# Patient Record
Sex: Male | Born: 1975 | Race: White | Hispanic: No | Marital: Married | Smoking: Former smoker
Health system: Southern US, Community
[De-identification: ages and names within clinical notes are randomized; demographics above are authoritative.]

## PROBLEM LIST (undated history)

## (undated) DIAGNOSIS — R51 Headache: Secondary | ICD-10-CM

## (undated) DIAGNOSIS — T4145XA Adverse effect of unspecified anesthetic, initial encounter: Secondary | ICD-10-CM

## (undated) DIAGNOSIS — K219 Gastro-esophageal reflux disease without esophagitis: Secondary | ICD-10-CM

## (undated) DIAGNOSIS — R519 Headache, unspecified: Secondary | ICD-10-CM

## (undated) DIAGNOSIS — T8859XA Other complications of anesthesia, initial encounter: Secondary | ICD-10-CM

## (undated) HISTORY — PX: APPENDECTOMY: SHX54

---
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
## 2015-11-01 ENCOUNTER — Other Ambulatory Visit: Payer: Self-pay

## 2015-11-01 ENCOUNTER — Encounter: Payer: Self-pay | Admitting: *Deleted

## 2015-11-01 NOTE — Patient Instructions (Signed)
  Your procedure is scheduled on: 11-02-15  Report to [facility] SAME DAY SURGERY 2ND FLOOR @ 1:45 (PT NOTIFIED OF TIME)   Remember: Instructions that are not followed completely may result in serious medical risk, up to and including death, or upon the discretion of your surgeon and anesthesiologist your surgery may need to be rescheduled.    _X___ 1. Do not eat food or drink liquids after midnight. No gum chewing or hard candies.     _X___ 2. No Alcohol for 24 hours before or after surgery.   ____ 3. Bring all medications with you on the day of surgery if instructed.    ____ 4. Notify your doctor if there is any change in your medical condition     (cold, fever, infections).     Do not wear jewelry, make-up, hairpins, clips or nail polish.  Do not wear lotions, powders, or perfumes. You may wear deodorant.  Do not shave 48 hours prior to surgery. Men may shave face and neck.  Do not bring valuables to the hospital.    [facility] is not responsible for any belongings or valuables.               Contacts, dentures or bridgework may not be worn into surgery.  Leave your suitcase in the car. After surgery it may be brought to your room.  For patients admitted to the hospital, discharge time is determined by your treatment team.   Patients discharged the day of surgery will not be allowed to drive home.   Please read over the following fact sheets that you were given:     ____ Take these medicines the morning of surgery with A SIP OF WATER:    1. NONE  2.   3.   4.  5.  6.  ____ Fleet Enema (as directed)   ____ Use CHG Soap as directed  ____ Use inhalers on the day of surgery  ____ Stop metformin 2 days prior to surgery    ____ Take 1/2 of usual insulin dose the night before surgery and none on the morning of surgery.   ____ Stop Coumadin/Plavix/aspirin-N/A  _X__ Stop Anti-inflammatories-STOP ADVIL NOW-NO NSAIDS OR ASA PRODUCTS-TYLENOL OK   ____ Stop supplements  until after surgery.    ____ Bring C-Pap to the hospital.

## 2015-11-02 ENCOUNTER — Ambulatory Visit
Admission: RE | Admit: 2015-11-02 | Discharge: 2015-11-02 | Disposition: A | Discharge status: Home / Self Care | Payer: 59 | Source: Ambulatory Visit | Attending: Surgery | Admitting: Surgery

## 2015-11-02 ENCOUNTER — Encounter: Payer: Self-pay | Admitting: *Deleted

## 2015-11-02 ENCOUNTER — Ambulatory Visit: Payer: 59 | Admitting: Anesthesiology

## 2015-11-02 ENCOUNTER — Encounter
Admission: RE | Disposition: A | Discharge status: Home / Self Care | Payer: Self-pay | Source: Ambulatory Visit | Attending: Surgery

## 2015-11-02 DIAGNOSIS — K219 Gastro-esophageal reflux disease without esophagitis: Secondary | ICD-10-CM | POA: Diagnosis not present

## 2015-11-02 DIAGNOSIS — Z87891 Personal history of nicotine dependence: Secondary | ICD-10-CM | POA: Insufficient documentation

## 2015-11-02 DIAGNOSIS — G5621 Lesion of ulnar nerve, right upper limb: Secondary | ICD-10-CM | POA: Insufficient documentation

## 2015-11-02 HISTORY — DX: Headache: R51

## 2015-11-02 HISTORY — DX: Gastro-esophageal reflux disease without esophagitis: K21.9

## 2015-11-02 HISTORY — PX: ULNAR NERVE TRANSPOSITION: SHX2595

## 2015-11-02 HISTORY — DX: Headache, unspecified: R51.9

## 2015-11-02 SURGERY — ULNAR NERVE DECOMPRESSION/TRANSPOSITION
Anesthesia: General | Site: Arm Upper | Laterality: Right | Wound class: Clean

## 2015-11-02 MED ORDER — ONDANSETRON HCL 4 MG/2ML IJ SOLN
INTRAMUSCULAR | Status: DC | PRN
  Administered 2015-11-02: 4 mg via INTRAVENOUS

## 2015-11-02 MED ORDER — POTASSIUM CHLORIDE IN NACL 20-0.9 MEQ/L-% IV SOLN: INTRAVENOUS | Status: DC

## 2015-11-02 MED ORDER — ONDANSETRON HCL 4 MG/2ML IJ SOLN: 4.0000 mg | Freq: Once | INTRAMUSCULAR | Status: DC | PRN

## 2015-11-02 MED ORDER — DEXAMETHASONE SODIUM PHOSPHATE 10 MG/ML IJ SOLN
INTRAMUSCULAR | Status: DC | PRN
  Administered 2015-11-02: 10 mg via INTRAVENOUS

## 2015-11-02 MED ORDER — DEXTROSE 5 % IV SOLN
3.0000 g | Freq: Once | INTRAVENOUS | Status: AC
  Administered 2015-11-02: 3 g via INTRAVENOUS
  Filled 2015-11-02: qty 3000

## 2015-11-02 MED ORDER — ONDANSETRON HCL 4 MG/2ML IJ SOLN
INTRAMUSCULAR | Status: AC
  Filled 2015-11-02: qty 2

## 2015-11-02 MED ORDER — METOCLOPRAMIDE HCL 5 MG/ML IJ SOLN: 5.0000 mg | Freq: Three times a day (TID) | INTRAMUSCULAR | Status: DC | PRN

## 2015-11-02 MED ORDER — NEOMYCIN-POLYMYXIN B GU 40-200000 IR SOLN
Status: DC | PRN
  Administered 2015-11-02: 2 mL

## 2015-11-02 MED ORDER — BUPIVACAINE HCL (PF) 0.5 % IJ SOLN
INTRAMUSCULAR | Status: AC
Start: 2015-11-02 — End: 2015-11-02
  Filled 2015-11-02: qty 30

## 2015-11-02 MED ORDER — METOCLOPRAMIDE HCL 10 MG PO TABS: 5.0000 mg | ORAL_TABLET | Freq: Three times a day (TID) | ORAL | Status: DC | PRN

## 2015-11-02 MED ORDER — MIDAZOLAM HCL 2 MG/2ML IJ SOLN
INTRAMUSCULAR | Status: DC | PRN
  Administered 2015-11-02: 2 mg via INTRAVENOUS

## 2015-11-02 MED ORDER — OXYCODONE HCL 5 MG PO TABS
ORAL_TABLET | ORAL | Status: AC
  Filled 2015-11-02: qty 1

## 2015-11-02 MED ORDER — FAMOTIDINE 20 MG PO TABS
20.0000 mg | ORAL_TABLET | Freq: Once | ORAL | Status: AC
  Administered 2015-11-02: 20 mg via ORAL

## 2015-11-02 MED ORDER — ONDANSETRON HCL 4 MG/2ML IJ SOLN
4.0000 mg | Freq: Four times a day (QID) | INTRAMUSCULAR | Status: DC | PRN
  Administered 2015-11-02: 4 mg via INTRAVENOUS

## 2015-11-02 MED ORDER — FENTANYL CITRATE (PF) 100 MCG/2ML IJ SOLN
INTRAMUSCULAR | Status: DC | PRN
  Administered 2015-11-02 (×3): 50 ug via INTRAVENOUS

## 2015-11-02 MED ORDER — FENTANYL CITRATE (PF) 100 MCG/2ML IJ SOLN
25.0000 ug | INTRAMUSCULAR | Status: DC | PRN
  Administered 2015-11-02 (×5): 25 ug via INTRAVENOUS

## 2015-11-02 MED ORDER — LIDOCAINE HCL (CARDIAC) 20 MG/ML IV SOLN
INTRAVENOUS | Status: DC | PRN
  Administered 2015-11-02: 30 mg via INTRAVENOUS

## 2015-11-02 MED ORDER — FENTANYL CITRATE (PF) 100 MCG/2ML IJ SOLN
INTRAMUSCULAR | Status: AC
  Filled 2015-11-02: qty 2

## 2015-11-02 MED ORDER — BUPIVACAINE HCL (PF) 0.5 % IJ SOLN
INTRAMUSCULAR | Status: DC | PRN
  Administered 2015-11-02: 20 mL

## 2015-11-02 MED ORDER — OXYCODONE HCL 5 MG PO TABS: 5.0000 mg | ORAL_TABLET | ORAL | Status: DC | PRN

## 2015-11-02 MED ORDER — OXYCODONE HCL 5 MG PO TABS
5.0000 mg | ORAL_TABLET | ORAL | Status: DC | PRN
  Administered 2015-11-02: 5 mg via ORAL

## 2015-11-02 MED ORDER — NEOMYCIN-POLYMYXIN B GU 40-200000 IR SOLN
Status: AC
  Filled 2015-11-02: qty 2

## 2015-11-02 MED ORDER — ONDANSETRON HCL 4 MG PO TABS: 4.0000 mg | ORAL_TABLET | Freq: Four times a day (QID) | ORAL | Status: DC | PRN

## 2015-11-02 MED ORDER — PROPOFOL 10 MG/ML IV BOLUS
INTRAVENOUS | Status: DC | PRN
  Administered 2015-11-02: 300 mg via INTRAVENOUS

## 2015-11-02 MED ORDER — FAMOTIDINE 20 MG PO TABS
ORAL_TABLET | ORAL | Status: AC
  Administered 2015-11-02: 20 mg via ORAL
  Filled 2015-11-02: qty 1

## 2015-11-02 MED ORDER — LACTATED RINGERS IV SOLN
INTRAVENOUS | Status: DC
  Administered 2015-11-02: 14:00:00 via INTRAVENOUS

## 2015-11-02 SURGICAL SUPPLY — 28 items
BANDAGE ELASTIC 4 LF NS (GAUZE/BANDAGES/DRESSINGS) ×3 IMPLANT
BNDG COHESIVE 4X5 TAN STRL (GAUZE/BANDAGES/DRESSINGS) ×3 IMPLANT
BNDG ESMARK 4X12 TAN STRL LF (GAUZE/BANDAGES/DRESSINGS) ×3 IMPLANT
CANISTER SUCT 1200ML W/VALVE (MISCELLANEOUS) ×3 IMPLANT
CHLORAPREP W/TINT 26ML (MISCELLANEOUS) ×6 IMPLANT
CORD BIP STRL DISP 12FT (MISCELLANEOUS) ×3 IMPLANT
FORCEPS JEWEL BIP 4-3/4 STR (INSTRUMENTS) ×3 IMPLANT
GAUZE PETRO XEROFOAM 1X8 (MISCELLANEOUS) ×3 IMPLANT
GAUZE SPONGE 4X4 12PLY STRL (GAUZE/BANDAGES/DRESSINGS) ×3 IMPLANT
GLOVE INDICATOR 8.0 STRL GRN (GLOVE) ×3 IMPLANT
GOWN STRL REUS W/ TWL LRG LVL3 (GOWN DISPOSABLE) ×1 IMPLANT
GOWN STRL REUS W/ TWL XL LVL3 (GOWN DISPOSABLE) ×1 IMPLANT
GOWN STRL REUS W/TWL LRG LVL3 (GOWN DISPOSABLE) ×2
GOWN STRL REUS W/TWL XL LVL3 (GOWN DISPOSABLE) ×2
KIT RM TURNOVER STRD PROC AR (KITS) ×3 IMPLANT
LOOP RED MAXI  1X406MM (MISCELLANEOUS) ×2
LOOP VESSEL MAXI 1X406 RED (MISCELLANEOUS) ×1 IMPLANT
NS IRRIG 500ML POUR BTL (IV SOLUTION) ×3 IMPLANT
PACK EXTREMITY ARMC (MISCELLANEOUS) ×3 IMPLANT
PAD ABD DERMACEA PRESS 5X9 (GAUZE/BANDAGES/DRESSINGS) ×3 IMPLANT
PAD CAST CTTN 4X4 STRL (SOFTGOODS) ×3 IMPLANT
PAD GROUND ADULT SPLIT (MISCELLANEOUS) ×3 IMPLANT
PADDING CAST COTTON 4X4 STRL (SOFTGOODS) ×6
STAPLER SKIN PROX 35W (STAPLE) ×3 IMPLANT
STOCKINETTE IMPERVIOUS 9X36 MD (GAUZE/BANDAGES/DRESSINGS) ×3 IMPLANT
SUT VIC AB 2-0 CT1 36 (SUTURE) ×3 IMPLANT
SUT VIC AB 3-0 SH 27 (SUTURE) ×2
SUT VIC AB 3-0 SH 27X BRD (SUTURE) ×1 IMPLANT

## 2015-11-02 NOTE — Discharge Instructions (Addendum)
Keep dressing dry and intact. Keep hand elevated above heart level. Use sling as necessary for comfort. May shower after dressing removed on postop day 4 (Monday). Cover sutures with Band-Aids after drying off. Apply ice to affected area frequently.Return for follow-up in 10-14 days or as scheduled     .AMBULATORY SURGERY  DISCHARGE INSTRUCTIONS   1) The drugs that you were given will stay in your system until tomorrow so for the next 24 hours you should not:  A) Drive an automobile B) Make any legal decisions C) Drink any alcoholic beverage   2) You may resume regular meals tomorrow.  Today it is better to start with liquids and gradually work up to solid foods.  You may eat anything you prefer, but it is better to start with liquids, then soup and crackers, and gradually work up to solid foods.   3) Please notify your doctor immediately if you have any unusual bleeding, trouble breathing, redness and pain at the surgery site, drainage, fever, or pain not relieved by medication. 4)   5) Your post-operative visit with Dr.                                     is: Date:                        Time:    Please call to schedule your post-operative visit.  6) Additional Instructions:

## 2015-11-02 NOTE — Transfer of Care (Signed)
Immediate Anesthesia Transfer of Care Note  Patient: Ryan Stephenson  Procedure(s) Performed: Procedure(s): ULNAR NERVE DECOMPRESSION/TRANSPOSITION (Right)  Patient Location: PACU  Anesthesia Type:General  Level of Consciousness: patient cooperative and lethargic  Airway & Oxygen Therapy: Patient Spontanous Breathing and Patient connected to face mask oxygen  Post-op Assessment: Report given to RN and Post -op Vital signs reviewed and stable  Post vital signs: Reviewed and stable  Last Vitals:  Filed Vitals:   11/02/15 1638  BP: 148/109  Temp: 37.7 C  Resp: 19    Complications: no anesthetic complications

## 2015-11-02 NOTE — Anesthesia Postprocedure Evaluation (Signed)
Anesthesia Post Note  Patient: Ryan ButtnerSteven Stephenson  Procedure(s) Performed: Procedure(s) (LRB): ULNAR NERVE DECOMPRESSION/TRANSPOSITION (Right)  Patient location during evaluation: PACU Anesthesia Type: General Level of consciousness: awake and alert Pain management: pain level controlled Vital Signs Assessment: post-procedure vital signs reviewed and stable Respiratory status: spontaneous breathing and respiratory function stable Cardiovascular status: blood pressure returned to baseline and stable Anesthetic complications: no    Last Vitals:  Filed Vitals:   11/02/15 1638  BP: 148/109  Pulse: 109  Temp: 37.7 C  Resp: 19    Last Pain:  Filed Vitals:   11/02/15 1646  PainSc: 0-No pain                 Lilith Solana K

## 2015-11-02 NOTE — Anesthesia Procedure Notes (Signed)
Procedure Name: LMA Insertion Date/Time: 11/02/2015 3:23 PM Performed by: Omer JackWEATHERLY, Ryan Trowbridge Pre-anesthesia Checklist: Patient identified, Patient being monitored, Timeout performed, Emergency Drugs available and Suction available Patient Re-evaluated:Patient Re-evaluated prior to inductionOxygen Delivery Method: Circle system utilized Preoxygenation: Pre-oxygenation with 100% oxygen Intubation Type: IV induction Ventilation: Mask ventilation without difficulty LMA: LMA inserted LMA Size: 4.5 Tube type: Oral Number of attempts: 1 Placement Confirmation: positive ETCO2 and breath sounds checked- equal and bilateral Tube secured with: Tape Dental Injury: Teeth and Oropharynx as per pre-operative assessment

## 2015-11-02 NOTE — Anesthesia Preprocedure Evaluation (Signed)
Anesthesia Evaluation  Patient identified by MRN, date of birth, ID band Patient awake    Reviewed: Allergy & Precautions, NPO status , Patient's Chart, lab work & pertinent test results  Airway Mallampati: II  TM Distance: >3 FB Neck ROM: Full    Dental no notable dental hx.    Pulmonary former smoker,    Pulmonary exam normal breath sounds clear to auscultation       Cardiovascular negative cardio ROS Normal cardiovascular exam     Neuro/Psych  Headaches, negative psych ROS   GI/Hepatic Neg liver ROS, GERD  Medicated and Controlled,  Endo/Other  negative endocrine ROS  Renal/GU negative Renal ROS  negative genitourinary   Musculoskeletal negative musculoskeletal ROS (+)   Abdominal Normal abdominal exam  (+)   Peds negative pediatric ROS (+)  Hematology negative hematology ROS (+)   Anesthesia Other Findings   Reproductive/Obstetrics                             Anesthesia Physical Anesthesia Plan  ASA: II  Anesthesia Plan: General   Post-op Pain Management:    Induction: Intravenous  Airway Management Planned: Oral ETT  Additional Equipment:   Intra-op Plan:   Post-operative Plan: Extubation in OR  Informed Consent: I have reviewed the patients History and Physical, chart, labs and discussed the procedure including the risks, benefits and alternatives for the proposed anesthesia with the patient or authorized representative who has indicated his/her understanding and acceptance.   Dental advisory given  Plan Discussed with: CRNA and Surgeon  Anesthesia Plan Comments:         Anesthesia Quick Evaluation

## 2015-11-02 NOTE — H&P (Signed)
Paper H&P to be scanned into permanent record. H&P reviewed. No changes. 

## 2015-11-02 NOTE — Op Note (Signed)
11/02/2015  4:34 PM  Patient:   Ryan Stephenson  Pre-Op Diagnosis:   Right cubital tunnel syndrome.  Post-Op Diagnosis:   Same  Procedure:   Subcutaneous anterior transposition ulnar nerve, right elbow.  Surgeon:   Maryagnes AmosJ. Jeffrey Poggi, MD  Assistant:   None  Anesthesia:   General LMA  Findings:   As above.  Complications:   None  EBL:   <5 cc  Fluids:   550 cc crystalloid  TT:   48 minutes at 250 mmHg  Drains:   None  Closure:   3-0 Vicryl subcuticular sutures  Brief Clinical Note:   The patient is a 39 year old male with a history of progressively worsening pain and paresthesias to the right ring and little fingers. His symptoms have persisted despite medications, activity modification, etc. The patient's history and examination were consistent with cubital tunnel syndrome, confirmed by an EMG. The patient presents at this time for subcutaneous anterior transposition of the ulnar nerve at the right elbow.  Procedure:   The patient was brought into the operating room and lain in the supine position. After adequate general laryngal mask anesthesia was obtained, the patient's right upper extremity was prepped with ChloraPrep solution before being draped sterilely. Preoperative antibiotics were administered. After performing a timeout to verify the appropriate surgical site, the limb was exsanguinated with an Esmarch and the tourniquet inflated to 250 mmHg. An approximately 7-8 cm curvilinear incision was made along the course of the ulnar nerve posterior to the medial epicondyle. The incision was carried down through the subcutaneous tissues with care taken to avoid the small branches of the medial antebrachial nerve to expose the sheath overlying the cubital tunnel. The ulnar nerve was identified at the proximal end of the tunnel and was dissected free. The nerve was then carefully followed as the roof of the cubital tunnel was released from proximal to distal. Distally, the fascia  overlying the pronator muscle was released for several centimeters. The nerve was clearly thickened just proximal to the pronator fascia, indicating the most likely source of the nerve compression. A vessel loop was passed around the nerve and used to provide gentle traction on the nerve while circumferential dissection was carried out under loupe magnification using bipolar electrocautery and tenotomy scissors.   Once the nerve was fully mobilized, the anterior tissues were elevated as a flap just superficial to the fascia overlying the flexor wad and a pocket created to accept the nerve. Care was taken to be sure that there was no undue tension along the nerve either proximally or distally. The nerve was carefully retracted while several 2-0 Vicryl interrupted sutures were placed to reapproximate the flap to the medial epicondylar soft tissues, thereby creating a "sling" for the ulnar nerve. The cubital tunnel itself was reapproximated using several 2-0 Vicryl interrupted sutures in order to prevent the nerve from falling back into the cubital tunnel.   The wound was copiously irrigated with sterile saline solution before the subcutaneous tissues were closed using 2-0 Vicryl interrupted sutures. The skin was closed using 3-0 Vicryl subcuticular sutures. A total of 20 cc of 0.5% plain Sensorcaine was injected in and around the incision to help with postoperative analgesia. A sterile bulky dressing was applied to the arm before the patient was placed into a sling. The patient was then awakened, extubated, and returned to the recovery room in satisfactory condition after tolerating the procedure well.

## 2015-11-03 ENCOUNTER — Encounter: Payer: Self-pay | Admitting: Surgery

## 2019-10-20 ENCOUNTER — Other Ambulatory Visit: Payer: Self-pay | Admitting: Student

## 2019-10-20 DIAGNOSIS — S83241D Other tear of medial meniscus, current injury, right knee, subsequent encounter: Secondary | ICD-10-CM

## 2019-10-20 DIAGNOSIS — M1711 Unilateral primary osteoarthritis, right knee: Secondary | ICD-10-CM

## 2019-10-20 DIAGNOSIS — S83411D Sprain of medial collateral ligament of right knee, subsequent encounter: Secondary | ICD-10-CM

## 2019-10-31 ENCOUNTER — Other Ambulatory Visit: Payer: Self-pay

## 2019-10-31 ENCOUNTER — Ambulatory Visit
Admission: RE | Admit: 2019-10-31 | Discharge: 2019-10-31 | Disposition: A | Discharge status: Home / Self Care | Payer: 59 | Source: Ambulatory Visit | Attending: Student | Admitting: Student

## 2019-10-31 DIAGNOSIS — S83411D Sprain of medial collateral ligament of right knee, subsequent encounter: Secondary | ICD-10-CM | POA: Insufficient documentation

## 2019-10-31 DIAGNOSIS — M1711 Unilateral primary osteoarthritis, right knee: Secondary | ICD-10-CM | POA: Insufficient documentation

## 2019-10-31 DIAGNOSIS — S83241D Other tear of medial meniscus, current injury, right knee, subsequent encounter: Secondary | ICD-10-CM | POA: Insufficient documentation

## 2019-12-06 ENCOUNTER — Other Ambulatory Visit: Payer: Self-pay | Admitting: Surgery

## 2019-12-07 ENCOUNTER — Other Ambulatory Visit: Payer: Self-pay

## 2019-12-07 ENCOUNTER — Other Ambulatory Visit
Admission: RE | Admit: 2019-12-07 | Discharge: 2019-12-07 | Disposition: A | Discharge status: Home / Self Care | Payer: 59 | Source: Ambulatory Visit | Attending: Surgery | Admitting: Surgery

## 2019-12-07 HISTORY — DX: Other complications of anesthesia, initial encounter: T88.59XA

## 2019-12-07 NOTE — Pre-Procedure Instructions (Signed)
Copied from [place] 07/08/2019 [place] Component Name Value Ref Range  EKG Systolic BP  mmHg  EKG Diastolic BP  mmHg  EKG Ventricular Rate 88 BPM  EKG Atrial Rate 88 BPM  EKG P-R Interval 152 ms  EKG QRS Duration 90 ms  EKG Q-T Interval 356 ms  EKG QTC Calculation 430 ms  EKG Calculated P Axis 27 degrees  EKG Calculated R Axis 3 degrees  EKG Calculated T Axis 25 degrees  QTC Fredericia 404 ms  Result Narrative  NORMAL SINUS RHYTHM NORMAL ECG  Confirmed by Dimas [place] [phone]) on 07/08/2019 10:42:27 AM  Other Result Information  Interface, Rad Results In - 07/08/2019 10:42 AM EDT NORMAL SINUS RHYTHM NORMAL ECG  Confirmed by Dimas [place] [phone]) on 07/08/2019 10:42:27 AM     CBC w/ Differential (07/07/2019 10:58 PM EDT) CBC w/ Differential (07/07/2019 10:58 PM EDT)  Component Value Ref Range Performed At Pathologist Signature  WBC 10.2 3.4 - 10.8 10*9/L [place] [facility]   RBC 5.05 4.14 - 5.80 10*12/L [place]   HGB 15.3 12.6 - 17.7 g/dL [place]   HCT 44.9 37.5 - 51.0 % [place] [facility]   MCV 88.9 79.0 - 97.0 fL [place]   MCH 30.3 27.0 - 33.0 pg [place]   MCHC 34.1 31.5 - 35.7 g/dL [place]   RDW 13.8 12.3 - 15.4 % [place]   MPV 9.9 9.0 - 12.0 fL [place] [facility]   Platelet 204 155 - 379 10*9/L [place] [facility]   Neutrophils % 58.1 % [place] [facility]   Lymphocytes % 31.4 % [place] [facility]   Monocytes % 8.8 % [place] [facility]   Eosinophils % 1.3 % [place] [facility]   Basophils % 0.4 % [place] [facility]   Absolute Neutrophils 5.9 1.4 - 7.0 10*9/L [place] [facility]   Absolute Lymphocytes 3.2 (H) 0.7 - 3.1 10*9/L [place]  [facility]   Absolute Monocytes 0.9 0.1 - 0.9 10*9/L [place] [facility]   Absolute Eosinophils 0.1 0.0 - 0.4 10*9/L [place] [facility]   Absolute Basophils 0.0 0.0 - 0.2 10*9/L [place]    CBC w/ Differential (07/07/2019 10:58 PM EDT)  Specimen  Blood   CBC w/ Differential (07/07/2019 10:58 PM EDT)  Performing Organization Address City/State/ZIP Code Phone Number  [place]  [place], [place] [address]  [phone]   Back to top of Lab Results    CBC w/ Differential (07/07/2019 10:58 PM EDT) CBC w/ Differential (07/07/2019 10:58 PM EDT)  Specimen  Blood   CBC w/ Differential (07/07/2019 10:58 PM EDT)  Narrative Performed At  The following orders were created for panel order CBC w/ Differential.  Procedure                Abnormality     Status            ---------                -----------     ------            CBC w/ Differential[[id number]]     Abnormal      Final result           Please view results for these tests  on the individual orders.     Back to top of Lab Results    Basic Metabolic Panel (61/60/7371 10:58 PM EDT) Basic Metabolic Panel (05/13/9484 10:58 PM EDT)  Component Value Ref Range Performed At Pathologist Signature  Sodium 141 135 - 145 mmol/L [place]   Potassium 3.9 3.5 - 5.0 mmol/L [facility]   Chloride 106 98 - 107 mmol/L [place] [facility]   CO2 24.0 22.0 - 32.0 mmol/L [place] [facility]   Anion Gap 11 7 - 15 mmol/L [place] [facility]   BUN 13 7 - 21 mg/dL [place] [facility]   Creatinine 0.80 0.70 - 1.30 mg/dL [place] [facility]   BUN/Creatinine Ratio 16  [place] [facility]   EGFR CKD-EPI Non-African American, Male >90 >=60 mL/min/1.36m [facility]   EGFR CKD-EPI African American, Male >90 >=60 mL/min/1.[facility]   Glucose 92 74 - 106 mg/dL [facility]   Calcium 9.3 8.5 - 10.2 mg/dL [facility]    Basic Metabolic Panel (0846/27/03500:58 PM EDT)  Specimen  Blood   Basic Metabolic Panel (0809/38/18290:58 PM EDT)  Performing Organization Address City/State/ZIP Code Phone Number  [id number] [phone] Back to top of Lab Results

## 2019-12-07 NOTE — Patient Instructions (Signed)
Your procedure is scheduled on: 12/10/19 Report to DAY SURGERY DEPARTMENT LOCATED ON 2ND FLOOR [facility] ENTRANCE. To find out your arrival time please call [phone] between 1PM - 3PM on 12/09/19 .  Remember: Instructions that are not followed completely may result in serious medical risk, up to and including death, or upon the discretion of your surgeon and anesthesiologist your surgery may need to be rescheduled.     _X__ 1. Do not eat food after midnight the night before your procedure.                 No gum chewing or hard candies. You may drink clear liquids up to 2 hours                 before you are scheduled to arrive for your surgery- DO not drink clear                 liquids within 2 hours of the start of your surgery.                 Clear Liquids include:  water, apple juice without pulp, clear carbohydrate                 drink such as Clearfast or Gatorade, Black Coffee or Tea (Do not add                 anything to coffee or tea). Diabetics water only  __X__2.  On the morning of surgery brush your teeth with toothpaste and water, you                 may rinse your mouth with mouthwash if you wish.  Do not swallow any              toothpaste of mouthwash.     _X__ 3.  No Alcohol for 24 hours before or after surgery.   _X__ 4.  Do Not Smoke or use e-cigarettes For 24 Hours Prior to Your Surgery.                 Do not use any chewable tobacco products for at least 6 hours prior to                 surgery.  ____  5.  Bring all medications with you on the day of surgery if instructed.   __X__  6.  Notify your doctor if there is any change in your medical condition      (cold, fever, infections).     Do not wear jewelry, make-up, hairpins, clips or nail polish. Do not wear lotions, powders, or perfumes.  Do not shave 48 hours prior to surgery. Men may shave face and neck. Do not bring valuables to the hospital.    [facility] is not responsible for any belongings  or valuables.  Contacts, dentures/partials or body piercings may not be worn into surgery. Bring a case for your contacts, glasses or hearing aids, a denture cup will be supplied. Leave your suitcase in the car. After surgery it may be brought to your room. For patients admitted to the hospital, discharge time is determined by your treatment team.   Patients discharged the day of surgery will not be allowed to drive home.   Please read over the following fact sheets that you were given:   MRSA Information  __X__ Take these medicines the morning of surgery with A SIP OF WATER:  1. none  2.   3.   4.  5.  6.  ____ Fleet Enema (as directed)   __X__ Use CHG Soap/SAGE wipes as directed  ____ Use inhalers on the day of surgery  ____ Stop metformin/Janumet/Farxiga 2 days prior to surgery    ____ Take 1/2 of usual insulin dose the night before surgery. No insulin the morning          of surgery.   ____ Stop Blood Thinners Coumadin/Plavix/Xarelto/Pleta/Pradaxa/Eliquis/Effient/Aspirin  on   Or contact your Surgeon, Cardiologist or Medical Doctor regarding  ability to stop your blood thinners  __X__ Stop Anti-inflammatories 7 days before surgery such as Advil, Ibuprofen, Motrin,  BC or Goodies Powder, Naprosyn, Naproxen, Aleve, Aspirin    __X__ Stop all herbal supplements, fish oil or vitamin E until after surgery.    ____ Bring C-Pap to the hospital.     The Ensure pre surgery drinks needs to be completed 2 hours before arrival. Bring the Incentive Spirometer the day of procedure if you have any questions and your pre or post op nurse can assist you.

## 2019-12-08 ENCOUNTER — Other Ambulatory Visit
Admission: RE | Admit: 2019-12-08 | Discharge: 2019-12-08 | Disposition: A | Discharge status: Home / Self Care | Payer: 59 | Source: Ambulatory Visit | Attending: Surgery | Admitting: Surgery

## 2019-12-08 NOTE — Progress Notes (Signed)
Positive covid test in care everywhere on 10/27/2019. Does not need a repeat covid test at this time.

## 2019-12-09 MED ORDER — DEXTROSE 5 % IV SOLN
3.0000 g | INTRAVENOUS | Status: AC
  Administered 2019-12-10: 3 g via INTRAVENOUS
  Filled 2019-12-09: qty 3

## 2019-12-10 ENCOUNTER — Encounter
Admission: RE | Disposition: A | Discharge status: Home / Self Care | Payer: Self-pay | Source: Home / Self Care | Attending: Surgery

## 2019-12-10 ENCOUNTER — Ambulatory Visit
Admission: RE | Admit: 2019-12-10 | Discharge: 2019-12-10 | Disposition: A | Discharge status: Home / Self Care | Payer: 59 | Attending: Surgery | Admitting: Surgery

## 2019-12-10 ENCOUNTER — Ambulatory Visit: Payer: 59 | Admitting: Anesthesiology

## 2019-12-10 ENCOUNTER — Other Ambulatory Visit: Payer: Self-pay

## 2019-12-10 ENCOUNTER — Encounter: Payer: Self-pay | Admitting: Surgery

## 2019-12-10 DIAGNOSIS — X58XXXA Exposure to other specified factors, initial encounter: Secondary | ICD-10-CM | POA: Diagnosis not present

## 2019-12-10 DIAGNOSIS — Z79899 Other long term (current) drug therapy: Secondary | ICD-10-CM | POA: Diagnosis not present

## 2019-12-10 DIAGNOSIS — S83231S Complex tear of medial meniscus, current injury, right knee, sequela: Secondary | ICD-10-CM

## 2019-12-10 DIAGNOSIS — S83241A Other tear of medial meniscus, current injury, right knee, initial encounter: Secondary | ICD-10-CM | POA: Diagnosis not present

## 2019-12-10 DIAGNOSIS — M1711 Unilateral primary osteoarthritis, right knee: Secondary | ICD-10-CM | POA: Insufficient documentation

## 2019-12-10 DIAGNOSIS — Z87891 Personal history of nicotine dependence: Secondary | ICD-10-CM | POA: Insufficient documentation

## 2019-12-10 HISTORY — PX: KNEE ARTHROSCOPY WITH MEDIAL MENISECTOMY: SHX5651

## 2019-12-10 SURGERY — ARTHROSCOPY, KNEE, WITH MEDIAL MENISCECTOMY
Anesthesia: General | Site: Knee | Laterality: Right

## 2019-12-10 MED ORDER — LIDOCAINE HCL (CARDIAC) PF 100 MG/5ML IV SOSY
PREFILLED_SYRINGE | INTRAVENOUS | Status: DC | PRN
  Administered 2019-12-10: 100 mg via INTRAVENOUS

## 2019-12-10 MED ORDER — PROPOFOL 500 MG/50ML IV EMUL
INTRAVENOUS | Status: AC
  Filled 2019-12-10: qty 50

## 2019-12-10 MED ORDER — BUPIVACAINE HCL (PF) 0.5 % IJ SOLN
INTRAMUSCULAR | Status: AC
  Filled 2019-12-10: qty 60

## 2019-12-10 MED ORDER — OXYCODONE HCL 5 MG PO TABS
5.0000 mg | ORAL_TABLET | ORAL | Status: DC | PRN
  Filled 2019-12-10: qty 2

## 2019-12-10 MED ORDER — ONDANSETRON HCL 4 MG/2ML IJ SOLN
INTRAMUSCULAR | Status: DC | PRN
  Administered 2019-12-10: 4 mg via INTRAVENOUS

## 2019-12-10 MED ORDER — FENTANYL CITRATE (PF) 100 MCG/2ML IJ SOLN: 25.0000 ug | INTRAMUSCULAR | Status: DC | PRN

## 2019-12-10 MED ORDER — OXYCODONE HCL 5 MG/5ML PO SOLN: 5.0000 mg | Freq: Once | ORAL | Status: DC | PRN

## 2019-12-10 MED ORDER — LIDOCAINE HCL 1 % IJ SOLN
INTRAMUSCULAR | Status: DC | PRN
  Administered 2019-12-10: 30 mL

## 2019-12-10 MED ORDER — EPINEPHRINE PF 1 MG/ML IJ SOLN
INTRAMUSCULAR | Status: AC
  Filled 2019-12-10: qty 1

## 2019-12-10 MED ORDER — PHENYLEPHRINE HCL (PRESSORS) 10 MG/ML IV SOLN
INTRAVENOUS | Status: DC | PRN
  Administered 2019-12-10: 100 ug via INTRAVENOUS

## 2019-12-10 MED ORDER — ACETAMINOPHEN 10 MG/ML IV SOLN
INTRAVENOUS | Status: AC
  Filled 2019-12-10: qty 100

## 2019-12-10 MED ORDER — ACETAMINOPHEN 10 MG/ML IV SOLN
INTRAVENOUS | Status: DC | PRN
  Administered 2019-12-10: 1000 mg via INTRAVENOUS

## 2019-12-10 MED ORDER — KETOROLAC TROMETHAMINE 30 MG/ML IJ SOLN
INTRAMUSCULAR | Status: DC | PRN
  Administered 2019-12-10: 30 mg via INTRAVENOUS

## 2019-12-10 MED ORDER — LACTATED RINGERS IV SOLN: INTRAVENOUS | Status: DC

## 2019-12-10 MED ORDER — METOCLOPRAMIDE HCL 10 MG PO TABS: 5.0000 mg | ORAL_TABLET | Freq: Three times a day (TID) | ORAL | Status: DC | PRN

## 2019-12-10 MED ORDER — FAMOTIDINE 20 MG PO TABS
ORAL_TABLET | ORAL | Status: AC
  Administered 2019-12-10: 20 mg via ORAL
  Filled 2019-12-10: qty 1

## 2019-12-10 MED ORDER — OXYCODONE HCL 5 MG PO TABS: 5.0000 mg | ORAL_TABLET | ORAL | 0 refills | Status: DC | PRN

## 2019-12-10 MED ORDER — METOCLOPRAMIDE HCL 5 MG/ML IJ SOLN: 5.0000 mg | Freq: Three times a day (TID) | INTRAMUSCULAR | Status: DC | PRN

## 2019-12-10 MED ORDER — POTASSIUM CHLORIDE IN NACL 20-0.9 MEQ/L-% IV SOLN: INTRAVENOUS | Status: DC

## 2019-12-10 MED ORDER — MIDAZOLAM HCL 2 MG/2ML IJ SOLN
INTRAMUSCULAR | Status: AC
  Filled 2019-12-10: qty 2

## 2019-12-10 MED ORDER — FENTANYL CITRATE (PF) 250 MCG/5ML IJ SOLN
INTRAMUSCULAR | Status: AC
  Filled 2019-12-10: qty 5

## 2019-12-10 MED ORDER — BUPIVACAINE-EPINEPHRINE (PF) 0.5% -1:200000 IJ SOLN
INTRAMUSCULAR | Status: DC | PRN
  Administered 2019-12-10 (×2): 30 mL

## 2019-12-10 MED ORDER — DEXMEDETOMIDINE HCL 200 MCG/2ML IV SOLN
INTRAVENOUS | Status: DC | PRN
  Administered 2019-12-10: 20 ug via INTRAVENOUS

## 2019-12-10 MED ORDER — OXYCODONE HCL 5 MG PO TABS: 5.0000 mg | ORAL_TABLET | Freq: Once | ORAL | Status: DC | PRN

## 2019-12-10 MED ORDER — DEXAMETHASONE SODIUM PHOSPHATE 10 MG/ML IJ SOLN
INTRAMUSCULAR | Status: DC | PRN
  Administered 2019-12-10: 10 mg via INTRAVENOUS

## 2019-12-10 MED ORDER — FENTANYL CITRATE (PF) 100 MCG/2ML IJ SOLN
INTRAMUSCULAR | Status: DC | PRN
  Administered 2019-12-10 (×2): 25 ug via INTRAVENOUS

## 2019-12-10 MED ORDER — LIDOCAINE HCL (PF) 1 % IJ SOLN
INTRAMUSCULAR | Status: AC
  Filled 2019-12-10: qty 30

## 2019-12-10 MED ORDER — GLYCOPYRROLATE 0.2 MG/ML IJ SOLN
INTRAMUSCULAR | Status: DC | PRN
  Administered 2019-12-10: .2 mg via INTRAVENOUS

## 2019-12-10 MED ORDER — ONDANSETRON HCL 4 MG PO TABS: 4.0000 mg | ORAL_TABLET | Freq: Four times a day (QID) | ORAL | Status: DC | PRN

## 2019-12-10 MED ORDER — MIDAZOLAM HCL 2 MG/2ML IJ SOLN
INTRAMUSCULAR | Status: DC | PRN
  Administered 2019-12-10: 2 mg via INTRAVENOUS

## 2019-12-10 MED ORDER — FAMOTIDINE 20 MG PO TABS: 20.0000 mg | ORAL_TABLET | Freq: Once | ORAL | Status: AC

## 2019-12-10 MED ORDER — PROPOFOL 10 MG/ML IV BOLUS
INTRAVENOUS | Status: DC | PRN
  Administered 2019-12-10: 200 mg via INTRAVENOUS

## 2019-12-10 MED ORDER — CHLORHEXIDINE GLUCONATE 4 % EX LIQD: 60.0000 mL | Freq: Once | CUTANEOUS | Status: DC

## 2019-12-10 MED ORDER — ONDANSETRON HCL 4 MG/2ML IJ SOLN: 4.0000 mg | Freq: Four times a day (QID) | INTRAMUSCULAR | Status: DC | PRN

## 2019-12-10 SURGICAL SUPPLY — 42 items
"PENCIL ELECTRO HAND CTR " (MISCELLANEOUS) ×1 IMPLANT
BAG COUNTER SPONGE EZ (MISCELLANEOUS) IMPLANT
BLADE FULL RADIUS 3.5 (BLADE) ×3 IMPLANT
BLADE SHAVER 4.5X7 STR FR (MISCELLANEOUS) ×3 IMPLANT
BNDG ELASTIC 6X5.8 VLCR STR LF (GAUZE/BANDAGES/DRESSINGS) ×3 IMPLANT
CARTRIDGE SUT 2-0 NONSTITCH (Anchor) ×4 IMPLANT
CHLORAPREP W/TINT 26 (MISCELLANEOUS) ×3 IMPLANT
COUNTER SPONGE BAG EZ (MISCELLANEOUS)
COVER WAND RF STERILE (DRAPES) ×3 IMPLANT
CUFF TOURN SGL QUICK 24 (TOURNIQUET CUFF)
CUFF TOURN SGL QUICK 30 (TOURNIQUET CUFF) ×2
CUFF TRNQT CYL 24X4X16.5-23 (TOURNIQUET CUFF) IMPLANT
CUFF TRNQT CYL 30X4X21-28X (TOURNIQUET CUFF) IMPLANT
ELECT REM PT RETURN 9FT ADLT (ELECTROSURGICAL) ×3
ELECTRODE REM PT RTRN 9FT ADLT (ELECTROSURGICAL) ×1 IMPLANT
GAUZE SPONGE 4X4 12PLY STRL (GAUZE/BANDAGES/DRESSINGS) ×3 IMPLANT
GAUZE XEROFORM 1X8 LF (GAUZE/BANDAGES/DRESSINGS) ×2 IMPLANT
GLOVE BIO SURGEON STRL SZ8 (GLOVE) ×6 IMPLANT
GLOVE BIOGEL M 7.0 STRL (GLOVE) ×8 IMPLANT
GLOVE BIOGEL PI IND STRL 7.5 (GLOVE) ×1 IMPLANT
GLOVE BIOGEL PI INDICATOR 7.5 (GLOVE) ×6
GLOVE INDICATOR 8.0 STRL GRN (GLOVE) ×3 IMPLANT
GOWN STRL REUS W/ TWL LRG LVL3 (GOWN DISPOSABLE) ×1 IMPLANT
GOWN STRL REUS W/ TWL XL LVL3 (GOWN DISPOSABLE) ×2 IMPLANT
GOWN STRL REUS W/TWL LRG LVL3 (GOWN DISPOSABLE) ×6
GOWN STRL REUS W/TWL XL LVL3 (GOWN DISPOSABLE) ×4
IV LACTATED RINGER IRRG 3000ML (IV SOLUTION) ×2
IV LR IRRIG 3000ML ARTHROMATIC (IV SOLUTION) ×1 IMPLANT
KIT TURNOVER KIT A (KITS) ×3 IMPLANT
MANAGER SUT NOVOCUT (CUTTER) ×2 IMPLANT
MANIFOLD NEPTUNE II (INSTRUMENTS) ×3 IMPLANT
NDL HYPO 21X1.5 SAFETY (NEEDLE) ×1 IMPLANT
NEEDLE HYPO 21X1.5 SAFETY (NEEDLE) ×3 IMPLANT
NOVOSTICH PRO MENISCAL 2-0 (Miscellaneous) ×3 IMPLANT
PACK ARTHROSCOPY KNEE (MISCELLANEOUS) ×3 IMPLANT
PENCIL ELECTRO HAND CTR (MISCELLANEOUS) ×3 IMPLANT
SUT PROLENE 4 0 PS 2 18 (SUTURE) ×3 IMPLANT
SUT TICRON COATED BLUE 2 0 30 (SUTURE) IMPLANT
SYR 50ML LL SCALE MARK (SYRINGE) ×3 IMPLANT
SYSTEM NVSTCH PRO MENISCAL 2-0 (Miscellaneous) IMPLANT
TUBING ARTHRO INFLOW-ONLY STRL (TUBING) ×5 IMPLANT
WAND WEREWOLF FLOW 90D (MISCELLANEOUS) ×3 IMPLANT

## 2019-12-10 NOTE — Transfer of Care (Signed)
Immediate Anesthesia Transfer of Care Note  Patient: Ryan Stephenson  Procedure(s) Performed: KNEE ARTHROSCOPY WITH DEBRIDEMENT AND REPAIR VERSUS PARTIAL MEDIAL MENISCECTOMY. (Right Knee)  Patient Location: PACU  Anesthesia Type:General  Level of Consciousness: awake, alert  and oriented  Airway & Oxygen Therapy: Patient Spontanous Breathing  Post-op Assessment: Report given to RN and Post -op Vital signs reviewed and stable  Post vital signs: Reviewed and stable  Last Vitals:  Vitals Value Taken Time  BP    Temp    Pulse 87 12/10/19 1429  Resp 15 12/10/19 1429  SpO2 91 % 12/10/19 1429  Vitals shown include unvalidated device data.  Last Pain:  Vitals:   12/10/19 1155  TempSrc: Oral  PainSc: 3          Complications: No apparent anesthesia complications

## 2019-12-10 NOTE — Progress Notes (Signed)
Component Name 10/27/2019 05/26/2019 04/15/2019 12/03/2018   Detected (A) Not Detected      Not Detected      Negative        Covid Result from Care Everywhere, Positive on 10/27/19.

## 2019-12-10 NOTE — Anesthesia Procedure Notes (Signed)
Procedure Name: LMA Insertion Performed by: Shalev Helminiak L, CRNA Pre-anesthesia Checklist: Patient identified, Patient being monitored, Timeout performed, Emergency Drugs available and Suction available Patient Re-evaluated:Patient Re-evaluated prior to induction Oxygen Delivery Method: Circle system utilized Preoxygenation: Pre-oxygenation with 100% oxygen Induction Type: IV induction Ventilation: Mask ventilation without difficulty LMA: LMA inserted LMA Size: 5.0 Tube type: Oral Number of attempts: 1 Placement Confirmation: positive ETCO2 and breath sounds checked- equal and bilateral Tube secured with: Tape Dental Injury: Teeth and Oropharynx as per pre-operative assessment        

## 2019-12-10 NOTE — Op Note (Signed)
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  Postoperative diagnosis:   Same  Procedure:   Arthroscopic medial meniscus repair, right knee.  Surgeon:   Maryagnes Amos, M.D.  Anesthesia:   General LMA  Findings:   As above.  There was a primarily horizontal cleavage tear involving the posterior medial portion of the medial meniscus.  The lateral meniscus was in satisfactory condition, as were the anterior and posterior cruciate ligaments.  There were grade 2 chondromalacial changes involving the central ridge of the patella, but the remainder of the articular surfaces were in excellent condition.  Complications:   None.  EBL:   2 cc.  Total fluids:   750 cc of crystalloid.  Tourniquet time:   None  Drains:   None  Closure:   4-0 Prolene interrupted sutures.  Brief clinical note:   The patient is a 44 year old male with a several month history of medial sided right knee pain. His symptoms have persisted despite medications, activity modification, etc. He has history and examination are consistent with a medial meniscus tear, confirmed by MRI scan. The patient presents at this time for arthroscopy, debridement, and repair versus partial medial meniscectomy.  Procedure:   The patient was brought into the operating room and lain in the supine position. After adequate general laryngeal mask anesthesia was obtained, a timeout was performed to verify the appropriate side. The patient's right knee was injected sterilely using a solution of 30 cc of 1% lidocaine and 30 cc of 0.5% Sensorcaine with epinephrine. The right lower extremity was prepped with ChloraPrep solution before being draped sterilely. Preoperative antibiotics were administered. The expected portal sites were injected with 0.5% Sensorcaine with epinephrine before the camera was placed in the anterolateral portal and  instrumentation performed through the anteromedial portal.   The knee was sequentially examined beginning in the suprapatellar pouch, then progressing to the patellofemoral space, the medial gutter and compartment, the notch, and finally the lateral compartment and gutter. The findings were as described above. Abundant reactive synovial tissues anteriorly were debrided using the full-radius resector in order to improve visualization. The medial meniscus was carefully probed with the findings as described above. There was a small central tear which was debrided back to stable margins. Once this was done, the horizontal component of the tear was readily apparent. It was felt best to repair this component of the tear, so this was accomplished using three "hay bale" sutures using the [facility] first-pass device. Subsequent probing of the repair demonstrated excellent stability. The instruments were removed from the joint after suctioning the excess fluid.   The portal sites were closed using 4-0 Prolene interrupted sutures before a sterile bulky dressing was applied to the knee. The patient was then awakened, extubated, and returned to the recovery room in satisfactory condition after tolerating the procedure well.

## 2019-12-10 NOTE — Anesthesia Postprocedure Evaluation (Signed)
Anesthesia Post Note  Patient: Ryan Stephenson  Procedure(s) Performed: KNEE ARTHROSCOPY WITH DEBRIDEMENT AND REPAIR VERSUS PARTIAL MEDIAL MENISCECTOMY. (Right Knee)  Patient location during evaluation: PACU Anesthesia Type: General Level of consciousness: awake and alert Pain management: pain level controlled Vital Signs Assessment: post-procedure vital signs reviewed and stable Respiratory status: spontaneous breathing, nonlabored ventilation, respiratory function stable and patient connected to nasal cannula oxygen Cardiovascular status: blood pressure returned to baseline and stable Postop Assessment: no apparent nausea or vomiting Anesthetic complications: no     Last Vitals:  Vitals:   12/10/19 1519 12/10/19 1520  BP:  108/69  Pulse: 83 83  Resp: 14 14  Temp:  (!) 36.4 C  SpO2: 96% 96%    Last Pain:  Vitals:   12/10/19 1520  TempSrc:   PainSc: 0-No pain                 Cleda Mccreedy Abdulkareem Badolato

## 2019-12-10 NOTE — Discharge Instructions (Addendum)
Orthopedic discharge instructions: Keep dressing dry and intact.  May shower after dressing changed on post-op day #4 (Tuesday).  Cover sutures with Band-Aids after drying off. Apply ice frequently to knee. Take ibuprofen 800 mg TID with meals for 7-10 days, then as necessary. Take pain medication as prescribed or ES Tylenol when needed.  May weight-bear as tolerated - use crutches or walker as needed. Follow-up in 10-14 days or as scheduled.  AMBULATORY SURGERY  DISCHARGE INSTRUCTIONS   1) The drugs that you were given will stay in your system until tomorrow so for the next 24 hours you should not:  A) Drive an automobile B) Make any legal decisions C) Drink any alcoholic beverage   2) You may resume regular meals tomorrow.  Today it is better to start with liquids and gradually work up to solid foods.  You may eat anything you prefer, but it is better to start with liquids, then soup and crackers, and gradually work up to solid foods.   3) Please notify your doctor immediately if you have any unusual bleeding, trouble breathing, redness and pain at the surgery site, drainage, fever, or pain not relieved by medication.    4) Additional Instructions:   Please contact your physician with any problems or Same Day Surgery at [phone], Monday through Friday 6 am to 4 pm, or [place] at [facility] number at [phone].

## 2019-12-10 NOTE — H&P (Signed)
Paper H&P to be scanned into permanent record. H&P reviewed and patient re-examined. No changes. 

## 2019-12-10 NOTE — Anesthesia Preprocedure Evaluation (Signed)
Anesthesia Evaluation  Patient identified by MRN, date of birth, ID band Patient awake    Reviewed: Allergy & Precautions, H&P , NPO status , Patient's Chart, lab work & pertinent test results  History of Anesthesia Complications (+) history of anesthetic complications  Airway Mallampati: III  TM Distance: <3 FB Neck ROM: full    Dental  (+) Chipped   Pulmonary neg shortness of breath, former smoker,           Cardiovascular Exercise Tolerance: Good (-) angina(-) Past MI and (-) DOE negative cardio ROS       Neuro/Psych  Headaches, negative psych ROS   GI/Hepatic Neg liver ROS, GERD  Medicated and Controlled,  Endo/Other  negative endocrine ROS  Renal/GU      Musculoskeletal   Abdominal   Peds  Hematology negative hematology ROS (+)   Anesthesia Other Findings Past Medical History: No date: Complication of anesthesia     Comment:   per patient stopped breathing should be in 10/2015               notes No date: GERD (gastroesophageal reflux disease) No date: Headache     Comment:  MIGRAINES  Past Surgical History: No date: APPENDECTOMY 11/02/2015: ULNAR NERVE TRANSPOSITION; Right     Comment:  Procedure: ULNAR NERVE DECOMPRESSION/TRANSPOSITION;                Surgeon: Christena Flake, MD;  Location: [facility] ORS;  Service:              Orthopedics;  Laterality: Right;  BMI    Body Mass Index: 28.23 kg/m      Reproductive/Obstetrics negative OB ROS                             Anesthesia Physical Anesthesia Plan  ASA: II  Anesthesia Plan: General LMA   Post-op Pain Management:    Induction: Intravenous  PONV Risk Score and Plan: Dexamethasone, Ondansetron, Midazolam and Treatment may vary due to age or medical condition  Airway Management Planned: LMA  Additional Equipment:   Intra-op Plan:   Post-operative Plan: Extubation in OR  Informed Consent: I have reviewed the  patients History and Physical, chart, labs and discussed the procedure including the risks, benefits and alternatives for the proposed anesthesia with the patient or authorized representative who has indicated his/her understanding and acceptance.     Dental Advisory Given  Plan Discussed with: Anesthesiologist, CRNA and Surgeon  Anesthesia Plan Comments: (Patient consented for risks of anesthesia including but not limited to:  - adverse reactions to medications - damage to teeth, lips or other oral mucosa - sore throat or hoarseness - Damage to heart, brain, lungs or loss of life  Patient voiced understanding.)        Anesthesia Quick Evaluation

## 2020-05-12 ENCOUNTER — Other Ambulatory Visit: Payer: Self-pay | Admitting: Surgery

## 2020-05-12 DIAGNOSIS — S83231D Complex tear of medial meniscus, current injury, right knee, subsequent encounter: Secondary | ICD-10-CM

## 2020-05-12 DIAGNOSIS — M1711 Unilateral primary osteoarthritis, right knee: Secondary | ICD-10-CM

## 2020-05-23 ENCOUNTER — Ambulatory Visit
Admission: RE | Admit: 2020-05-23 | Discharge: 2020-05-23 | Disposition: A | Discharge status: Home / Self Care | Payer: 59 | Source: Ambulatory Visit | Attending: Surgery | Admitting: Surgery

## 2020-05-23 ENCOUNTER — Other Ambulatory Visit: Payer: Self-pay

## 2020-05-23 DIAGNOSIS — M1711 Unilateral primary osteoarthritis, right knee: Secondary | ICD-10-CM

## 2020-05-23 DIAGNOSIS — S83231D Complex tear of medial meniscus, current injury, right knee, subsequent encounter: Secondary | ICD-10-CM

## 2021-09-17 IMAGING — MR MR KNEE*R* W/O CM
7 series · 40 of 40 positions shown · non-contrast
Comparison: October 31, 2019

CLINICAL DATA: Anterior and lateral right knee pain for 1 year

EXAM:
MRI OF THE RIGHT KNEE WITHOUT CONTRAST
TECHNIQUE: Multiplanar, multisequence MR imaging of the knee was performed. No
intravenous contrast was administered.

[Series 8: T2 fat-sat · axial · right · 4.0mm · 0.50mm/px · z∈[-71,+53]mm · 5 of 26 slices shown (1 of 3)]
[im 1/26]
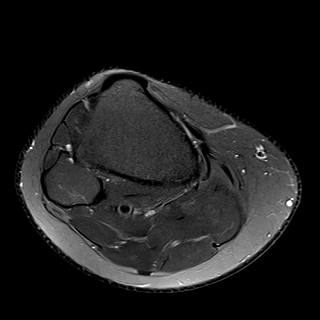
[im 7/26]
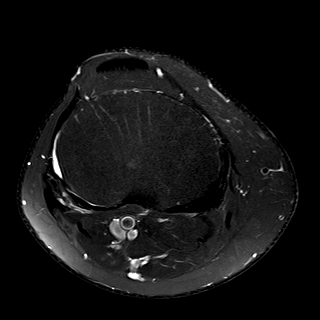
[im 13/26]
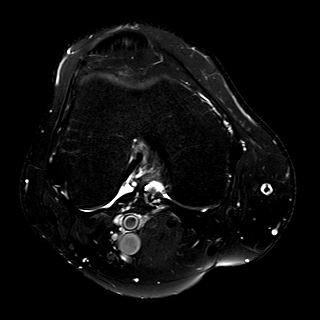
[im 19/26]
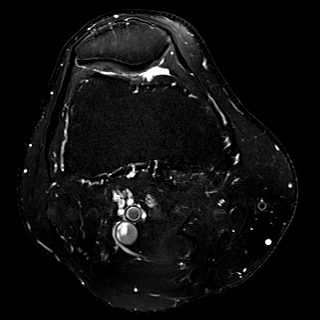
[im 26/26]
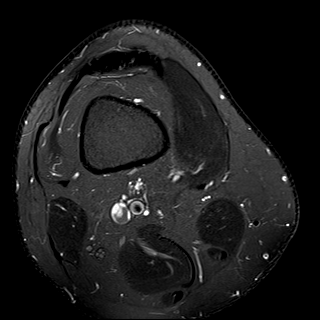

[Series 9: T2 fat-sat · coronal · right · 4.0mm · 0.59mm/px · 6 of 29 slices shown (2 of 3)]
[im 1/29]
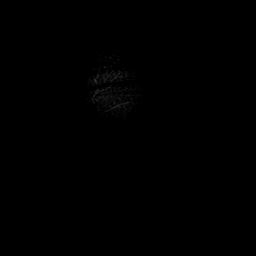
[im 6/29]
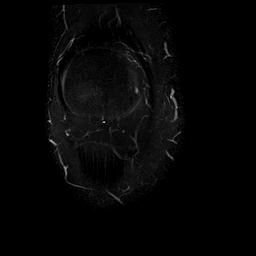
[im 12/29]
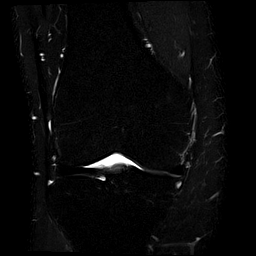
[im 17/29]
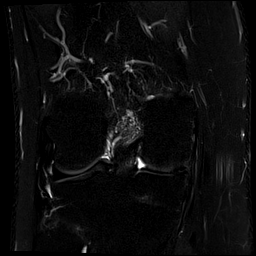
[im 23/29]
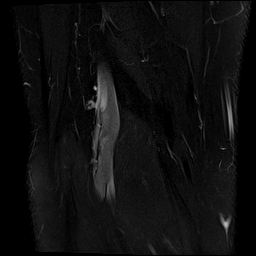
[im 29/29]
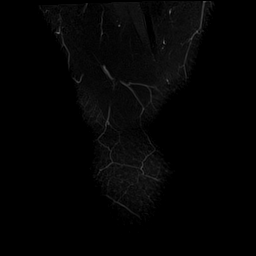

[Series 10: T1 · coronal · right · 4.0mm · 0.59mm/px · 6 of 30 slices shown]
[im 1/30]
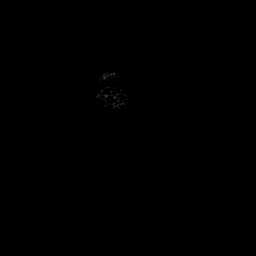
[im 6/30]
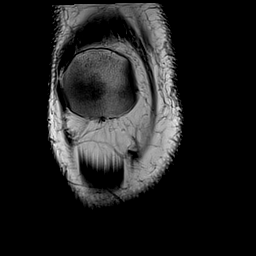
[im 12/30]
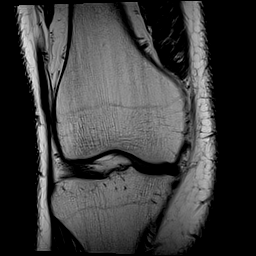
[im 18/30]
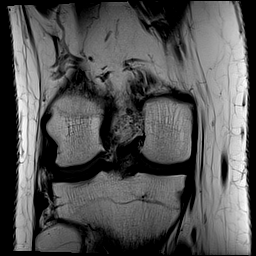
[im 24/30]
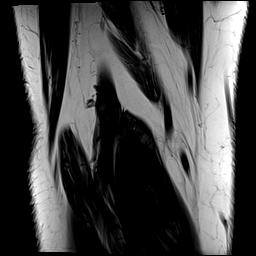
[im 30/30]
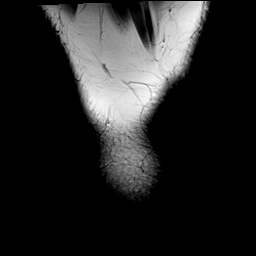

[Series 11: PD fat-sat · coronal · right · 4.0mm · 0.59mm/px · 6 of 30 slices shown (1 of 2)]
[im 1/30]
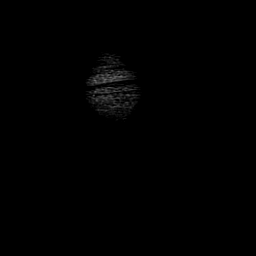
[im 6/30]
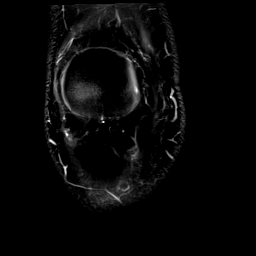
[im 12/30]
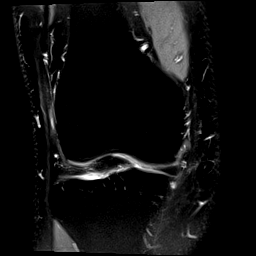
[im 18/30]
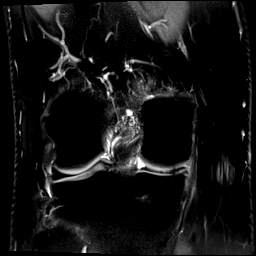
[im 24/30]
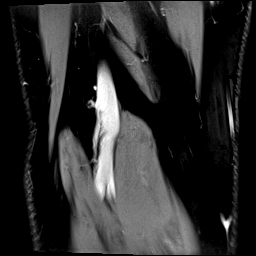
[im 30/30]
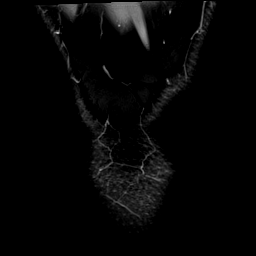

[Series 12: PD fat-sat · sagittal · right · 3.0mm · 0.59mm/px · 7 of 34 slices shown (2 of 2)]
[im 1/34]
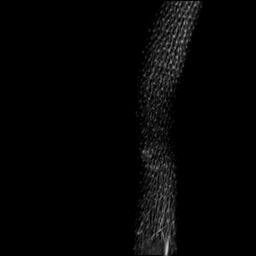
[im 6/34]
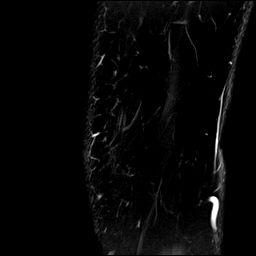
[im 12/34]
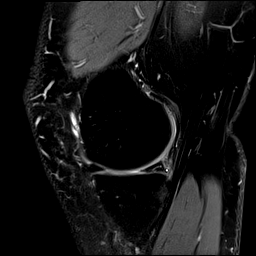
[im 17/34]
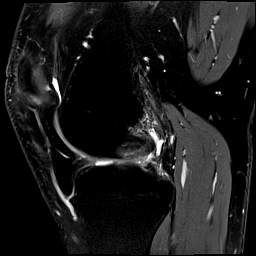
[im 23/34]
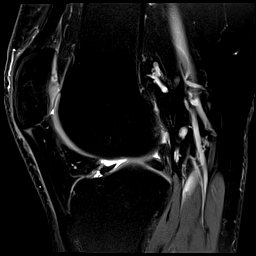
[im 28/34]
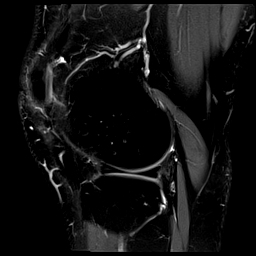
[im 34/34]
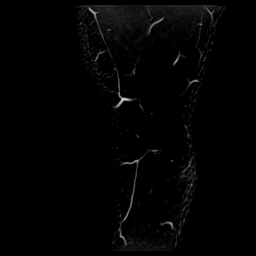

[Series 13: T2 fat-sat · sagittal · right · 3.0mm · 0.59mm/px · 7 of 35 slices shown (3 of 3)]
[im 1/35]
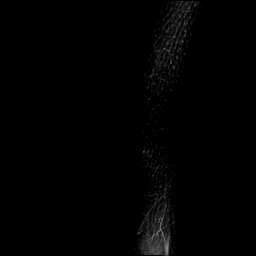
[im 6/35]
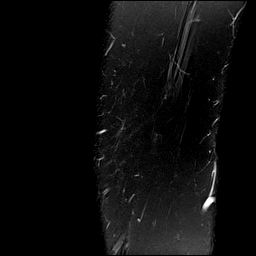
[im 12/35]
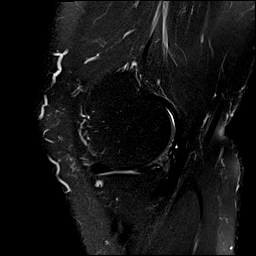
[im 18/35]
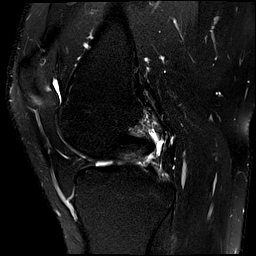
[im 23/35]
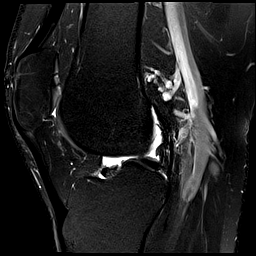
[im 29/35]
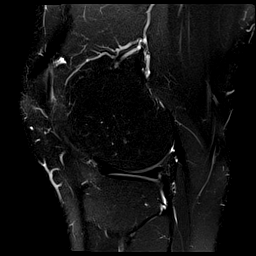
[im 35/35]
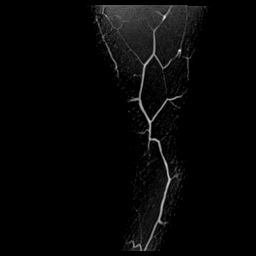

[Series 14: PD · coronal · right · 2.0mm · 0.47mm/px · 3 of 16 slices shown]
[im 1/16]
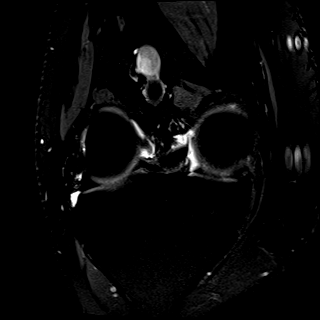
[im 8/16]
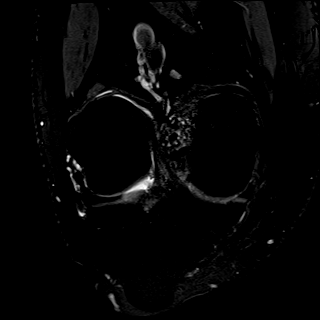
[im 16/16]
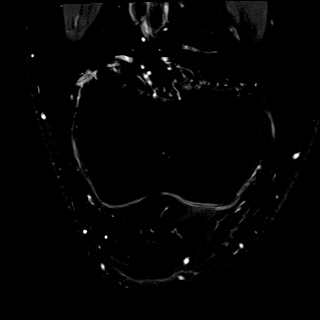

[40 of 40 positions shown; findings below may reference images not displayed]

FINDINGS: MENISCI

Medial: There is a horizontal longitudinal tear seen of the
posterior horn of the medial meniscus extending to the mid body,
which appears not significantly changed since the prior exam of
October 31, 2019. There is slight extrusion of the mid body. The
root attachment however is intact. No parameniscal cyst is seen.

Lateral: Increased intrameniscal signal seen in the anterior horn of
the lateral meniscus, however does not contact the underlying
articular surface.

LIGAMENTS

Cruciates: Mildly increased intrasubstance signal seen at the
anterior insertion site of the ACL, however it is intact. The PCL is
intact.

Collaterals: The MCL is intact. The lateral collateral ligamentous
complex is intact.

CARTILAGE

Patellofemoral: Mild chondral fissuring seen within the medial
patellar facet.

Medial compartment: Mild chondral thinning seen the weight-bearing
surface of the medial femoral condyle.

Lateral compartment: Normal.

BONES: No fracture. No avascular necrosis. No pathologic marrow
infiltration.

JOINT: No joint effusion. Normal [place]. No plical
thickening.

EXTENSOR MECHANISM: The patellar and quadriceps tendon are intact.
The retinaculum is unremarkable.

POPLITEAL FOSSA: No popliteal cyst.

OTHER: There is increased intrasubstance signal seen at the
insertion site of the medial head of the gastrocnemius.
IMPRESSION: Unchanged nondisplaced tear of the posterior medial meniscus with
slight extrusion of the mid body.

Mildly increased signal within the anterior ACL which could be due
to mild intrasubstance degeneration/sprain.

Mild medial and patellofemoral compartment chondral disease

## 2024-05-06 ENCOUNTER — Encounter: Payer: Self-pay | Admitting: Emergency Medicine

## 2024-05-06 ENCOUNTER — Ambulatory Visit
Admission: EM | Admit: 2024-05-06 | Discharge: 2024-05-06 | Disposition: A | Discharge status: Home / Self Care | Attending: Emergency Medicine | Admitting: Emergency Medicine

## 2024-05-06 DIAGNOSIS — A059 Bacterial foodborne intoxication, unspecified: Secondary | ICD-10-CM | POA: Diagnosis not present

## 2024-05-06 MED ORDER — ONDANSETRON 8 MG PO TBDP: 8.0000 mg | ORAL_TABLET | Freq: Three times a day (TID) | ORAL | 0 refills | Status: AC | PRN

## 2024-05-06 MED ORDER — DICYCLOMINE HCL 20 MG PO TABS: 20.0000 mg | ORAL_TABLET | Freq: Two times a day (BID) | ORAL | 0 refills | Status: AC

## 2024-05-06 NOTE — ED Provider Notes (Signed)
 [facility]    CSN: [id number] Arrival date & time: 05/06/24  0901      History   Chief Complaint Chief Complaint  Patient presents with   Diarrhea   Abdominal Pain    HPI Ryan Stephenson is a 48 y.o. male.   HPI  48 year old male with past medical history significant for migraine headaches and GERD presents for evaluation of diarrhea that began yesterday morning and abdominal cramping that started today.  He reports has had 5 diarrheal stools in total since his symptoms began.  He denies any fever, nausea or vomiting, or blood in the stool.  He reports that at work they had a catered lunch on Monday and the salad tasted funny.  Past Medical History:  Diagnosis Date   Complication of anesthesia     per patient stopped breathing should be in 10/2015 notes   GERD (gastroesophageal reflux disease)    Headache    MIGRAINES    There are no active problems to display for this patient.   Past Surgical History:  Procedure Laterality Date   APPENDECTOMY     KNEE ARTHROSCOPY WITH MEDIAL MENISECTOMY Right 12/10/2019   Procedure: KNEE ARTHROSCOPY WITH DEBRIDEMENT AND REPAIR VERSUS PARTIAL MEDIAL MENISCECTOMY.;  Surgeon: Elner Hahn, MD;  Location: [facility] OR[facility];  Service: Orthopedics;  Laterality: Right;   ULNAR NERVE TRANSPOSITION Right 11/02/2015   Procedure: ULNAR NERVE DECOMPRESSION/TRANSPOSITION;  Surgeon: Elner Hahn, MD;  Location: [facility] OR[facility];  Service: Orthopedics;  Laterality: Right;       Home Medications    Prior to Admission medications   Medication Sig Start Date End Date Taking? Authorizing Provider  dicyclomine (BENTYL) 20 MG tablet Take 1 tablet (20 mg total) by mouth 2 (two) times daily. 05/06/24  Yes Kent Pear, NP  ondansetron  (ZOFRAN -ODT) 8 MG disintegrating tablet Take 1 tablet (8 mg total) by mouth every 8 (eight) hours as needed for nausea or vomiting. 05/06/24  Yes Kent Pear, NP  oxyCODONE  (ROXICODONE ) 5 MG immediate release tablet Take  1-2 tablets (5-10 mg total) by mouth every 4 (four) hours as needed for moderate pain or severe pain. 12/10/19   Poggi, Kaylene Pascal, MD    Family History History reviewed. No pertinent family history.  Social History Social History   Tobacco Use   Smoking status: Former    Types: Cigarettes, Cigars   Smokeless tobacco: Former  Building services engineer status: Never Used  Substance Use Topics   Alcohol use: No   Drug use: No     Allergies   Patient has no known allergies.   Review of Systems Review of Systems  Constitutional:  Negative for fever.  Gastrointestinal:  Positive for abdominal pain and diarrhea. Negative for blood in stool, nausea and vomiting.       Abdominal cramping  Neurological:  Positive for headaches.     Physical Exam Triage Vital Signs ED Triage Vitals  Encounter Vitals Group     BP      Girls Systolic BP Percentile      Girls Diastolic BP Percentile      Boys Systolic BP Percentile      Boys Diastolic BP Percentile      Pulse      Resp      Temp      Temp src      SpO2      Weight      Height      Head Circumference  Peak Flow      Pain Score      Pain Loc      Pain Education      Exclude from Growth Chart    No data found.  Updated Vital Signs BP 130/89 (BP Location: Right Arm)   Pulse 93   Temp 98.7 F (37.1 C) (Oral)   Resp 18   SpO2 95%   Visual Acuity Right Eye Distance:   Left Eye Distance:   Bilateral Distance:    Right Eye Near:   Left Eye Near:    Bilateral Near:     Physical Exam Vitals and nursing note reviewed.  Constitutional:      Appearance: Normal appearance. He is not ill-appearing.  HENT:     Head: Normocephalic and atraumatic.   Cardiovascular:     Rate and Rhythm: Normal rate and regular rhythm.     Pulses: Normal pulses.     Heart sounds: Normal heart sounds. No murmur heard.    No friction rub. No gallop.  Pulmonary:     Effort: Pulmonary effort is normal.     Breath sounds: Normal breath  sounds. No wheezing, rhonchi or rales.  Abdominal:     Palpations: Abdomen is soft.     Tenderness: There is abdominal tenderness. There is no guarding or rebound.     Comments: Left lower quadrant abdominal tenderness without guarding.   Skin:    General: Skin is warm and dry.     Capillary Refill: Capillary refill takes less than 2 seconds.     Findings: No rash.   Neurological:     General: No focal deficit present.     Mental Status: He is alert and oriented to person, place, and time.      UC Treatments / Results  Labs (all labs ordered are listed, but only abnormal results are displayed) Labs Reviewed - No data to display  EKG   Radiology No results found.  Procedures Procedures (including critical care time)  Medications Ordered in UC Medications - No data to display  Initial Impression / Assessment and Plan / UC Course  I have reviewed the triage vital signs and the nursing notes.  Pertinent labs & imaging results that were available during my care of the patient were reviewed by me and considered in my medical decision making (see chart for details).   Patient is a pleasant, nontoxic-appearing 48 year old male presenting for evaluation of abdominal cramping and diarrhea as outlined HPI above.  He reports that at his work they had a catered lunch on Monday and that the salad he ate tasted funny.  They also ate chicken but he reports the chicken was improperly cooked.  Tuesday he was fine but when he woke up yesterday morning he immediately went to the bathroom and started having diarrhea stools.  He has had a total of 5 in the last 30 hours.  No nausea or vomiting and no blood in the stool.  On exam his abdomen is soft but is tender in the left lower quadrant.  Patient had a CT scan on 12/17/2021 of his abdomen and pelvis which did not show any evidence of colonic pathology or diverticulosis.  I suspect that the tenderness is coming from colonic inflammation, most  likely secondary to food poisoning.  He is able to eat drink fluids and eat some.  I will discharge him home with a diagnosis of food poisoning and a prescription for Zofran  that he can have on  hand should he develop nausea and vomiting.  He is to continue to orally rehydrate and also add fiber to his diet to help add bulk to his stools and resolve his diarrhea.  I have given him return and ER precautions.  Work note provided.  Patient, I will prescribe dicyclomine that he can have every 6 hours as needed for abdominal cramping.   Final Clinical Impressions(s) / UC Diagnoses   Final diagnoses:  Food poisoning     Discharge Instructions      Take the Zofran  every 8 hours as needed for nausea and vomiting.  They are an oral disintegrating tablet and you can place them on her under your tongue and then will be absorbed.  Use the Bentyl (dicyclomine) every 6 hours as needed for abdominal cramping.  Follow a clear liquid diet for the next 6 to 12 hours.  Clear liquids consist of broth, ginger ale, water, Pedialyte, and Jell-O.  After 6 to 12 hours, if you are tolerating clear liquids, you can advance to bland foods such as bananas, rice, applesauce, and toast.  If you tolerate bland foods you can continue to advance your diet as you see fit.  Adding fiber to your diet will help add bulk to your stool which should help resolve your diarrhea.  See the list of food choices in your discharge instructions as a dietary guideline.  If you develop a fever over 100.5, increased abdominal pain, bloody vomit, or bloody stool return for reevaluation or go to the ER.      ED Prescriptions     Medication Sig Dispense Auth. Provider   dicyclomine (BENTYL) 20 MG tablet Take 1 tablet (20 mg total) by mouth 2 (two) times daily. 20 tablet Kent Pear, NP   ondansetron  (ZOFRAN -ODT) 8 MG disintegrating tablet Take 1 tablet (8 mg total) by mouth every 8 (eight) hours as needed for nausea or vomiting. 20 tablet  Kent Pear, NP      PDMP not reviewed this encounter.   Kent Pear, NP 05/06/24 [phone]

## 2024-05-06 NOTE — Discharge Instructions (Signed)
 Take the Zofran  every 8 hours as needed for nausea and vomiting.  They are an oral disintegrating tablet and you can place them on her under your tongue and then will be absorbed.  Use the Bentyl (dicyclomine) every 6 hours as needed for abdominal cramping.  Follow a clear liquid diet for the next 6 to 12 hours.  Clear liquids consist of broth, ginger ale, water, Pedialyte, and Jell-O.  After 6 to 12 hours, if you are tolerating clear liquids, you can advance to bland foods such as bananas, rice, applesauce, and toast.  If you tolerate bland foods you can continue to advance your diet as you see fit.  Adding fiber to your diet will help add bulk to your stool which should help resolve your diarrhea.  See the list of food choices in your discharge instructions as a dietary guideline.  If you develop a fever over 100.5, increased abdominal pain, bloody vomit, or bloody stool return for reevaluation or go to the ER.

## 2024-05-06 NOTE — ED Triage Notes (Signed)
 Pt presents with diarrhea that started yesterday and abdominal cramping that started today.

## 2024-07-23 ENCOUNTER — Ambulatory Visit
Admission: EM | Admit: 2024-07-23 | Discharge: 2024-07-23 | Disposition: A | Discharge status: Home / Self Care | Attending: Family Medicine | Admitting: Family Medicine

## 2024-07-23 ENCOUNTER — Ambulatory Visit (INDEPENDENT_AMBULATORY_CARE_PROVIDER_SITE_OTHER)

## 2024-07-23 ENCOUNTER — Encounter: Payer: Self-pay | Admitting: Emergency Medicine

## 2024-07-23 DIAGNOSIS — M7918 Myalgia, other site: Secondary | ICD-10-CM

## 2024-07-23 DIAGNOSIS — W19XXXA Unspecified fall, initial encounter: Secondary | ICD-10-CM

## 2024-07-23 DIAGNOSIS — R0781 Pleurodynia: Secondary | ICD-10-CM

## 2024-07-23 MED ORDER — CYCLOBENZAPRINE HCL 5 MG PO TABS: 5.0000 mg | ORAL_TABLET | Freq: Three times a day (TID) | ORAL | 0 refills | Status: AC | PRN

## 2024-07-23 MED ORDER — NAPROXEN 500 MG PO TABS
500.0000 mg | ORAL_TABLET | Freq: Two times a day (BID) | ORAL | 0 refills | Status: AC
Start: 2024-07-23 — End: ?

## 2024-07-23 MED ORDER — OXYCODONE HCL 5 MG PO TABS: 5.0000 mg | ORAL_TABLET | Freq: Four times a day (QID) | ORAL | 0 refills | Status: AC | PRN

## 2024-07-23 NOTE — ED Triage Notes (Signed)
 Patient states that he fell down his stairs at his home on Tuesday.  Patient c/o bilateral rib pain and tailbone pain.  Patient denies hitting his head and no LOC.  Patient has been taking Tylenol  for pain.  Patient states that his pain is getting worse.

## 2024-07-23 NOTE — ED Provider Notes (Signed)
 [facility]    CSN: [id number] Arrival date & time: 07/23/24  1702      History   Chief Complaint Chief Complaint  Patient presents with   Fall   Tailbone Pain   Rib Pain    HPI  HPI Ryan Stephenson is a 48 y.o. male.   Ryan Stephenson presents after a fall down the stairs on Tuesday at home. He slid on the carpet. Says he was not wearing shoes. Having trouble breathing and chest tightness with deep breathing. Rib right side hurts worse than left.   Has some tailbone pain. He landed on his tailbone. Has painful sitting especially with certain poisitons and applying pressure to his tailbone. Taking Advil which helped some but no longer is helping. No trouble walking but is walking slower than normal. No other pain. He did have loss of consciousness or hit his head during the fall. He doesn't take any blood thinner.      Past Medical History:  Diagnosis Date   Complication of anesthesia     per patient stopped breathing should be in 10/2015 notes   GERD (gastroesophageal reflux disease)    Headache    MIGRAINES    There are no active problems to display for this patient.   Past Surgical History:  Procedure Laterality Date   APPENDECTOMY     KNEE ARTHROSCOPY WITH MEDIAL MENISECTOMY Right 12/10/2019   Procedure: KNEE ARTHROSCOPY WITH DEBRIDEMENT AND REPAIR VERSUS PARTIAL MEDIAL MENISCECTOMY.;  Surgeon: Ryan Ryan JINNY, MD;  Location: [facility];  Service: Orthopedics;  Laterality: Right;   ULNAR NERVE TRANSPOSITION Right 11/02/2015   Procedure: ULNAR NERVE DECOMPRESSION/TRANSPOSITION;  Surgeon: Ryan Stephenson Edie, MD;  Location: [facility];  Service: Orthopedics;  Laterality: Right;       Home Medications    Prior to Admission medications   Medication Sig Start Date End Date Taking? Authorizing Provider  cyclobenzaprine  (FLEXERIL ) 5 MG tablet Take 1 tablet (5 mg total) by mouth 3 (three) times daily as needed. 07/23/24  Yes Ryan Mathieson, DO  naproxen  (NAPROSYN ) 500 MG  tablet Take 1 tablet (500 mg total) by mouth 2 (two) times daily with a meal. 07/23/24  Yes Ryan Beegle, DO  dicyclomine  (BENTYL ) 20 MG tablet Take 1 tablet (20 mg total) by mouth 2 (two) times daily. 05/06/24   Ryan Ditch, NP  ondansetron  (ZOFRAN -ODT) 8 MG disintegrating tablet Take 1 tablet (8 mg total) by mouth every 8 (eight) hours as needed for nausea or vomiting. 05/06/24   Ryan Ditch, NP  oxyCODONE  (ROXICODONE ) 5 MG immediate release tablet Take 1-2 tablets (5-10 mg total) by mouth every 6 (six) hours as needed for moderate pain (pain score 4-6) or severe pain (pain score 7-10). 07/23/24   Ryan Berth, DO    Family History History reviewed. No pertinent family history.  Social History Social History   Tobacco Use   Smoking status: Former    Types: Cigarettes, Cigars   Smokeless tobacco: Former  Building services engineer status: Never Used  Substance Use Topics   Alcohol use: No   Drug use: No     Allergies   Patient has no known allergies.   Review of Systems Review of Systems: :negative unless otherwise stated in HPI.      Physical Exam Triage Vital Signs ED Triage Vitals  Encounter Vitals Group     BP 07/23/24 1716 (!) 134/98     Girls Systolic BP Percentile --      Girls  Diastolic BP Percentile --      Boys Systolic BP Percentile --      Boys Diastolic BP Percentile --      Pulse Rate 07/23/24 1716 89     Resp 07/23/24 1716 15     Temp 07/23/24 1716 99 F (37.2 C)     Temp Source 07/23/24 1716 Oral     SpO2 07/23/24 1716 95 %     Weight 07/23/24 1714 270 lb 1 oz (122.5 kg)     Height 07/23/24 1714 6' 10 (2.083 m)     Head Circumference --      Peak Flow --      Pain Score 07/23/24 1714 7     Pain Loc --      Pain Education --      Exclude from Growth Chart --    No data found.  Updated Vital Signs BP (!) 134/98 (BP Location: Right Arm)   Pulse 89   Temp 99 F (37.2 C) (Oral)   Resp 15   Ht 6' 10 (2.083 m)   Wt 122.5 kg   SpO2 95%   BMI  28.24 kg/m   Visual Acuity Right Eye Distance:   Left Eye Distance:   Bilateral Distance:    Right Eye Near:   Left Eye Near:    Bilateral Near:     Physical Exam GEN: well appearing male in no acute distress  CVS: well perfused, regular rate and rhythm RESP: speaking in full sentences without pause, no respiratory distress, clear bilaterally  CHEST WALL: No ecchymosis, no erythema, no step-offs, no bony deformity, no overlying skin changes, right sided ribs tender to palpation MSK: No midline C, T or L-spine tenderness. + Tenderness to palpation of the sacrum and coccyx, no gluteal muscular tenderness or piriformis tenderness SKIN: warm, dry    UC Treatments / Results  Labs (all labs ordered are listed, but only abnormal results are displayed) Labs Reviewed - No data to display  EKG   Radiology DG Sacrum/Coccyx Result Date: 07/23/2024 CLINICAL DATA:  Ryan Stephenson down stairs on Tuesday, pain EXAM: SACRUM AND COCCYX - 2+ VIEW COMPARISON:  None Available. FINDINGS: Pelvic inlet, pelvic outlet, and lateral views of the sacrum and coccyx are obtained. There are no acute displaced fractures. The hips are well aligned. Sacroiliac joints are normal. IMPRESSION: 1. No acute displaced fracture. Electronically Signed   By: Ryan Stephenson M.D.   On: 07/23/2024 18:50   DG Ribs Bilateral W/Chest Result Date: 07/23/2024 CLINICAL DATA:  Ryan Stephenson down stairs on Tuesday, bilateral rib pain EXAM: BILATERAL RIBS AND CHEST - 4+ VIEW COMPARISON:  None Available. FINDINGS: Frontal view of the chest as well as frontal and oblique views of the bilateral thoracic cage was obtained. Cardiac silhouette is unremarkable. No airspace disease, effusion, or pneumothorax. No acute displaced fractures. IMPRESSION: 1. No acute intrathoracic process.  No displaced rib fracture. Electronically Signed   By: Ryan Stephenson M.D.   On: 07/23/2024 18:49     Procedures Procedures (including critical care time)  Medications Ordered  in UC Medications - No data to display  Initial Impression / Assessment and Plan / UC Course  I have reviewed the triage vital signs and the nursing notes.  Pertinent labs & imaging results that were available during my care of the patient were reviewed by me and considered in my medical decision making (see chart for details).      Pt is a 48 y.o.  male who  presents after a fall down the stairs 3 days ago. He has bilateral rib pain and buttock pain. VSS. He is sating well on room air and is afebrile.  Declined pain control here.    On exam, pt has tenderness at right ribs, sacrum and coccyx  concerning for possible fracture.   Obtained bilateral rib series with chest and sacral/coccyx  plain films.  Personally interpreted by me were unremarkable for fracture or dislocation. Radiologist report reviewed and additionally notes no acute intrathoracic process.  no soft tissue swelling.    Patient to gradually return to normal activities, as tolerated and continue ordinary activities within the limits permitted by pain. Prescribed Naproxen  sodium, short course oxycodone  and muscle relaxer  for pain relief.  Tylenol  PRN. Advised patient to avoid OTC NSAIDs while taking prescription NSAID. Counseled patient on red flag symptoms and when to seek immediate care.   Patient to follow up with orthopedic provider, if symptoms do not improve with conservative treatment.  Return and ED precautions given. Understanding voiced. Discussed MDM, treatment plan and plan for follow-up with patient who agrees with plan.   Final Clinical Impressions(s) / UC Diagnoses   Final diagnoses:  Fall, initial encounter  Rib pain  Acute buttock pain     Discharge Instructions      Your chest xray did not show evidence of pneumonia.  On my review of your xray images, you did not have any fractures or dislocated ribs. The radiologist has not yet read your xray.  You should see your results in MyChart.   If medication  was prescribed, stop by the pharmacy to pick up your prescriptions.  For your  pain, Take 1500 mg Tylenol  twice a day, take muscle relaxer (Flexeril /cyclobenzaprine ) and Naprosyn , as needed for pain. SABRA  Apply warm compresses intermittently, as needed.  As pain recedes, begin normal activities slowly as tolerated.  Follow up with primary care provider or an orthopedic provider, if symptoms persist.  Watch for worsening symptoms such as an increasing weakness or loss of sensation, increasing pain and/or the loss of bladder or bowel function. Should any of these occur, go to the emergency department immediately.          ED Prescriptions     Medication Sig Dispense Auth. Provider   oxyCODONE  (ROXICODONE ) 5 MG immediate release tablet Take 1-2 tablets (5-10 mg total) by mouth every 6 (six) hours as needed for moderate pain (pain score 4-6) or severe pain (pain score 7-10). 12 tablet Vint Pola, DO   cyclobenzaprine  (FLEXERIL ) 5 MG tablet Take 1 tablet (5 mg total) by mouth 3 (three) times daily as needed. 30 tablet Skyeler Smola, DO   naproxen  (NAPROSYN ) 500 MG tablet Take 1 tablet (500 mg total) by mouth 2 (two) times daily with a meal. 30 tablet Macio Kissoon, DO      I have reviewed the PDMP during this encounter.   Charlie Char, DO 07/24/24 [phone]

## 2024-07-23 NOTE — Discharge Instructions (Signed)
 Your chest xray did not show evidence of pneumonia.  On my review of your xray images, you did not have any fractures or dislocated ribs. The radiologist has not yet read your xray.  You should see your results in MyChart.   If medication was prescribed, stop by the pharmacy to pick up your prescriptions.  For your  pain, Take 1500 mg Tylenol  twice a day, take muscle relaxer (Flexeril /cyclobenzaprine ) and Naprosyn , as needed for pain. SABRA  Apply warm compresses intermittently, as needed.  As pain recedes, begin normal activities slowly as tolerated.  Follow up with primary care provider or an orthopedic provider, if symptoms persist.  Watch for worsening symptoms such as an increasing weakness or loss of sensation, increasing pain and/or the loss of bladder or bowel function. Should any of these occur, go to the emergency department immediately.
